# Patient Record
Sex: Female | Born: 1950 | Race: Black or African American | Hispanic: No | Smoking: Never smoker
Health system: Southern US, Community
[De-identification: ages and names within clinical notes are randomized; demographics above are authoritative.]

## PROBLEM LIST (undated history)

## (undated) DIAGNOSIS — I10 Essential (primary) hypertension: Secondary | ICD-10-CM

## (undated) DIAGNOSIS — F32A Depression, unspecified: Secondary | ICD-10-CM

## (undated) DIAGNOSIS — J449 Chronic obstructive pulmonary disease, unspecified: Secondary | ICD-10-CM

## (undated) DIAGNOSIS — E119 Type 2 diabetes mellitus without complications: Secondary | ICD-10-CM

## (undated) DIAGNOSIS — E7800 Pure hypercholesterolemia, unspecified: Secondary | ICD-10-CM

## (undated) DIAGNOSIS — F329 Major depressive disorder, single episode, unspecified: Secondary | ICD-10-CM

## (undated) DIAGNOSIS — I509 Heart failure, unspecified: Secondary | ICD-10-CM

---

## 2016-10-28 ENCOUNTER — Inpatient Hospital Stay (HOSPITAL_COMMUNITY): Payer: Medicare Other

## 2016-10-28 ENCOUNTER — Emergency Department (HOSPITAL_COMMUNITY): Payer: Medicare Other

## 2016-10-28 ENCOUNTER — Encounter (HOSPITAL_COMMUNITY): Payer: Self-pay | Admitting: Emergency Medicine

## 2016-10-28 ENCOUNTER — Inpatient Hospital Stay (HOSPITAL_COMMUNITY)
Admission: EM | Admit: 2016-10-28 | Discharge: 2016-11-10 | DRG: 871 | Disposition: E | Payer: Medicare Other | Attending: Pulmonary Disease | Admitting: Pulmonary Disease

## 2016-10-28 DIAGNOSIS — Z79899 Other long term (current) drug therapy: Secondary | ICD-10-CM | POA: Diagnosis not present

## 2016-10-28 DIAGNOSIS — E119 Type 2 diabetes mellitus without complications: Secondary | ICD-10-CM | POA: Diagnosis present

## 2016-10-28 DIAGNOSIS — N179 Acute kidney failure, unspecified: Secondary | ICD-10-CM | POA: Diagnosis present

## 2016-10-28 DIAGNOSIS — T8579XA Infection and inflammatory reaction due to other internal prosthetic devices, implants and grafts, initial encounter: Secondary | ICD-10-CM

## 2016-10-28 DIAGNOSIS — E669 Obesity, unspecified: Secondary | ICD-10-CM | POA: Diagnosis present

## 2016-10-28 DIAGNOSIS — Z452 Encounter for adjustment and management of vascular access device: Secondary | ICD-10-CM

## 2016-10-28 DIAGNOSIS — M069 Rheumatoid arthritis, unspecified: Secondary | ICD-10-CM | POA: Diagnosis present

## 2016-10-28 DIAGNOSIS — J440 Chronic obstructive pulmonary disease with acute lower respiratory infection: Secondary | ICD-10-CM | POA: Diagnosis present

## 2016-10-28 DIAGNOSIS — A419 Sepsis, unspecified organism: Secondary | ICD-10-CM | POA: Diagnosis present

## 2016-10-28 DIAGNOSIS — G4733 Obstructive sleep apnea (adult) (pediatric): Secondary | ICD-10-CM | POA: Diagnosis present

## 2016-10-28 DIAGNOSIS — R578 Other shock: Secondary | ICD-10-CM | POA: Diagnosis present

## 2016-10-28 DIAGNOSIS — I5032 Chronic diastolic (congestive) heart failure: Secondary | ICD-10-CM | POA: Diagnosis present

## 2016-10-28 DIAGNOSIS — J9601 Acute respiratory failure with hypoxia: Secondary | ICD-10-CM | POA: Diagnosis present

## 2016-10-28 DIAGNOSIS — I469 Cardiac arrest, cause unspecified: Secondary | ICD-10-CM

## 2016-10-28 DIAGNOSIS — R6521 Severe sepsis with septic shock: Secondary | ICD-10-CM | POA: Diagnosis present

## 2016-10-28 DIAGNOSIS — Z66 Do not resuscitate: Secondary | ICD-10-CM | POA: Diagnosis present

## 2016-10-28 DIAGNOSIS — E785 Hyperlipidemia, unspecified: Secondary | ICD-10-CM | POA: Diagnosis present

## 2016-10-28 DIAGNOSIS — Z6841 Body Mass Index (BMI) 40.0 and over, adult: Secondary | ICD-10-CM

## 2016-10-28 DIAGNOSIS — G931 Anoxic brain damage, not elsewhere classified: Secondary | ICD-10-CM | POA: Diagnosis present

## 2016-10-28 DIAGNOSIS — J189 Pneumonia, unspecified organism: Secondary | ICD-10-CM | POA: Diagnosis present

## 2016-10-28 DIAGNOSIS — Z7982 Long term (current) use of aspirin: Secondary | ICD-10-CM | POA: Diagnosis not present

## 2016-10-28 DIAGNOSIS — Z8673 Personal history of transient ischemic attack (TIA), and cerebral infarction without residual deficits: Secondary | ICD-10-CM

## 2016-10-28 DIAGNOSIS — E872 Acidosis: Secondary | ICD-10-CM | POA: Diagnosis present

## 2016-10-28 DIAGNOSIS — Z7951 Long term (current) use of inhaled steroids: Secondary | ICD-10-CM

## 2016-10-28 DIAGNOSIS — I110 Hypertensive heart disease with heart failure: Secondary | ICD-10-CM | POA: Diagnosis present

## 2016-10-28 DIAGNOSIS — E7800 Pure hypercholesterolemia, unspecified: Secondary | ICD-10-CM | POA: Diagnosis present

## 2016-10-28 DIAGNOSIS — R0902 Hypoxemia: Secondary | ICD-10-CM

## 2016-10-28 HISTORY — DX: Essential (primary) hypertension: I10

## 2016-10-28 HISTORY — DX: Heart failure, unspecified: I50.9

## 2016-10-28 HISTORY — DX: Depression, unspecified: F32.A

## 2016-10-28 HISTORY — DX: Type 2 diabetes mellitus without complications: E11.9

## 2016-10-28 HISTORY — DX: Pure hypercholesterolemia, unspecified: E78.00

## 2016-10-28 HISTORY — DX: Chronic obstructive pulmonary disease, unspecified: J44.9

## 2016-10-28 HISTORY — DX: Major depressive disorder, single episode, unspecified: F32.9

## 2016-10-28 LAB — CBC WITH DIFFERENTIAL/PLATELET
BASOS PCT: 0 %
Band Neutrophils: 0 %
Basophils Absolute: 0 10*3/uL (ref 0.0–0.1)
Blasts: 0 %
EOS ABS: 3.5 10*3/uL — AB (ref 0.0–0.7)
EOS PCT: 20 %
HCT: 39.8 % (ref 36.0–46.0)
Hemoglobin: 12.9 g/dL (ref 12.0–15.0)
LYMPHS ABS: 5.7 10*3/uL — AB (ref 0.7–4.0)
Lymphocytes Relative: 33 %
MCH: 32.3 pg (ref 26.0–34.0)
MCHC: 32.4 g/dL (ref 30.0–36.0)
MCV: 99.5 fL (ref 78.0–100.0)
MONO ABS: 0.2 10*3/uL (ref 0.1–1.0)
MYELOCYTES: 0 %
Metamyelocytes Relative: 0 %
Monocytes Relative: 1 %
NEUTROS PCT: 46 %
NRBC: 12 /100{WBCs} — AB
Neutro Abs: 8 10*3/uL — ABNORMAL HIGH (ref 1.7–7.7)
PLATELETS: 60 10*3/uL — AB (ref 150–400)
Promyelocytes Absolute: 0 %
RBC: 4 MIL/uL (ref 3.87–5.11)
RDW: 14.7 % (ref 11.5–15.5)
WBC: 17.4 10*3/uL — AB (ref 4.0–10.5)

## 2016-10-28 LAB — URINALYSIS, ROUTINE W REFLEX MICROSCOPIC
BILIRUBIN URINE: NEGATIVE
GLUCOSE, UA: NEGATIVE mg/dL
KETONES UR: NEGATIVE mg/dL
Nitrite: NEGATIVE
PROTEIN: 30 mg/dL — AB
Specific Gravity, Urine: 1.021 (ref 1.005–1.030)
pH: 5 (ref 5.0–8.0)

## 2016-10-28 LAB — COMPREHENSIVE METABOLIC PANEL
ALT: 1348 U/L — AB (ref 14–54)
ANION GAP: 19 — AB (ref 5–15)
AST: 1870 U/L — ABNORMAL HIGH (ref 15–41)
Albumin: 2.4 g/dL — ABNORMAL LOW (ref 3.5–5.0)
Alkaline Phosphatase: 228 U/L — ABNORMAL HIGH (ref 38–126)
BUN: 50 mg/dL — ABNORMAL HIGH (ref 6–20)
CHLORIDE: 102 mmol/L (ref 101–111)
CO2: 11 mmol/L — AB (ref 22–32)
CREATININE: 4.01 mg/dL — AB (ref 0.44–1.00)
Calcium: 7.4 mg/dL — ABNORMAL LOW (ref 8.9–10.3)
GFR, EST AFRICAN AMERICAN: 13 mL/min — AB (ref >60)
GFR, EST NON AFRICAN AMERICAN: 11 mL/min — AB (ref >60)
Glucose, Bld: 114 mg/dL — ABNORMAL HIGH (ref 65–99)
POTASSIUM: 5.7 mmol/L — AB (ref 3.5–5.1)
SODIUM: 132 mmol/L — AB (ref 135–145)
Total Bilirubin: 2.5 mg/dL — ABNORMAL HIGH (ref 0.3–1.2)
Total Protein: 5.8 g/dL — ABNORMAL LOW (ref 6.5–8.1)

## 2016-10-28 LAB — I-STAT ARTERIAL BLOOD GAS, ED
ACID-BASE DEFICIT: 19 mmol/L — AB (ref 0.0–2.0)
Acid-base deficit: 26 mmol/L — ABNORMAL HIGH (ref 0.0–2.0)
BICARBONATE: 5.1 mmol/L — AB (ref 20.0–28.0)
Bicarbonate: 13.2 mmol/L — ABNORMAL LOW (ref 20.0–28.0)
O2 SAT: 42 %
O2 SAT: 95 %
PCO2 ART: 29.4 mmHg — AB (ref 32.0–48.0)
PH ART: 6.823 — AB (ref 7.350–7.450)
TCO2: 6 mmol/L (ref 0–100)
TCO2: 15 mmol/L (ref 0–100)
pCO2 arterial: 50.8 mmHg — ABNORMAL HIGH (ref 32.0–48.0)
pH, Arterial: 7.001 — CL (ref 7.350–7.450)
pO2, Arterial: 36 mmHg — CL (ref 83.0–108.0)
pO2, Arterial: 100 mmHg (ref 83.0–108.0)

## 2016-10-28 LAB — GLUCOSE, CAPILLARY
GLUCOSE-CAPILLARY: 92 mg/dL (ref 65–99)
Glucose-Capillary: 102 mg/dL — ABNORMAL HIGH (ref 65–99)
Glucose-Capillary: 96 mg/dL (ref 65–99)

## 2016-10-28 LAB — I-STAT CHEM 8, ED
BUN: 60 mg/dL — ABNORMAL HIGH (ref 6–20)
CALCIUM ION: 0.91 mmol/L — AB (ref 1.15–1.40)
Chloride: 105 mmol/L (ref 101–111)
Creatinine, Ser: 3.5 mg/dL — ABNORMAL HIGH (ref 0.44–1.00)
Glucose, Bld: 119 mg/dL — ABNORMAL HIGH (ref 65–99)
HEMATOCRIT: 38 % (ref 36.0–46.0)
HEMOGLOBIN: 12.9 g/dL (ref 12.0–15.0)
Potassium: 4.8 mmol/L (ref 3.5–5.1)
SODIUM: 137 mmol/L (ref 135–145)
TCO2: 14 mmol/L (ref 0–100)

## 2016-10-28 LAB — I-STAT TROPONIN, ED: Troponin i, poc: 0.03 ng/mL (ref 0.00–0.08)

## 2016-10-28 LAB — TYPE AND SCREEN
ABO/RH(D): B POS
ANTIBODY SCREEN: POSITIVE
DAT, IgG: POSITIVE

## 2016-10-28 LAB — I-STAT CG4 LACTIC ACID, ED: Lactic Acid, Venous: 3.99 mmol/L (ref 0.5–1.9)

## 2016-10-28 LAB — PROCALCITONIN: Procalcitonin: 1 ng/mL

## 2016-10-28 LAB — MRSA PCR SCREENING: MRSA BY PCR: POSITIVE — AB

## 2016-10-28 LAB — LACTIC ACID, PLASMA: Lactic Acid, Venous: 12.9 mmol/L (ref 0.5–1.9)

## 2016-10-28 LAB — APTT: aPTT: 130 seconds — ABNORMAL HIGH (ref 24–36)

## 2016-10-28 LAB — TROPONIN I: TROPONIN I: 0.29 ng/mL — AB (ref <0.03)

## 2016-10-28 MED ORDER — SODIUM CHLORIDE 0.9 % IV SOLN: INTRAVENOUS | Status: DC

## 2016-10-28 MED ORDER — MUPIROCIN 2 % EX OINT
1.0000 "application " | TOPICAL_OINTMENT | Freq: Two times a day (BID) | CUTANEOUS | Status: DC
  Administered 2016-10-29: 1 via NASAL
  Filled 2016-10-28: qty 22

## 2016-10-28 MED ORDER — CHLORHEXIDINE GLUCONATE CLOTH 2 % EX PADS: 6.0000 | MEDICATED_PAD | Freq: Every day | CUTANEOUS | Status: DC

## 2016-10-28 MED ORDER — INSULIN ASPART 100 UNIT/ML ~~LOC~~ SOLN: 0.0000 [IU] | SUBCUTANEOUS | Status: DC

## 2016-10-28 MED ORDER — PIPERACILLIN-TAZOBACTAM 3.375 G IVPB 30 MIN
3.3750 g | Freq: Once | INTRAVENOUS | Status: AC
  Administered 2016-10-28: 3.375 g via INTRAVENOUS
  Filled 2016-10-28: qty 50

## 2016-10-28 MED ORDER — MIDAZOLAM HCL 2 MG/2ML IJ SOLN: 2.0000 mg | Freq: Once | INTRAMUSCULAR | Status: DC

## 2016-10-28 MED ORDER — NOREPINEPHRINE BITARTRATE 1 MG/ML IV SOLN
0.0000 ug/min | Freq: Once | INTRAVENOUS | Status: AC
  Administered 2016-10-28: 10 ug/min via INTRAVENOUS
  Filled 2016-10-28: qty 4

## 2016-10-28 MED ORDER — METHYLPREDNISOLONE SODIUM SUCC 40 MG IJ SOLR
40.0000 mg | Freq: Two times a day (BID) | INTRAMUSCULAR | Status: DC
  Administered 2016-10-28: 40 mg via INTRAVENOUS
  Filled 2016-10-28 (×2): qty 1

## 2016-10-28 MED ORDER — NOREPINEPHRINE BITARTRATE 1 MG/ML IV SOLN: 0.0000 ug/min | INTRAVENOUS | Status: DC

## 2016-10-28 MED ORDER — FAMOTIDINE IN NACL 20-0.9 MG/50ML-% IV SOLN: 20.0000 mg | Freq: Two times a day (BID) | INTRAVENOUS | Status: DC

## 2016-10-28 MED ORDER — FENTANYL CITRATE (PF) 100 MCG/2ML IJ SOLN
50.0000 ug | Freq: Once | INTRAMUSCULAR | Status: AC
  Administered 2016-10-28: 50 ug via INTRAVENOUS

## 2016-10-28 MED ORDER — HYDROCORTISONE NA SUCCINATE PF 100 MG IJ SOLR
50.0000 mg | Freq: Four times a day (QID) | INTRAMUSCULAR | Status: DC
  Administered 2016-10-29: 50 mg via INTRAVENOUS
  Filled 2016-10-28: qty 2

## 2016-10-28 MED ORDER — HEPARIN SODIUM (PORCINE) 5000 UNIT/ML IJ SOLN
5000.0000 [IU] | Freq: Three times a day (TID) | INTRAMUSCULAR | Status: DC
  Filled 2016-10-28 (×2): qty 1

## 2016-10-28 MED ORDER — SODIUM CHLORIDE 0.9 % IV BOLUS (SEPSIS)
1000.0000 mL | Freq: Once | INTRAVENOUS | Status: AC
  Administered 2016-10-29: 1000 mL via INTRAVENOUS

## 2016-10-28 MED ORDER — NOREPINEPHRINE BITARTRATE 1 MG/ML IV SOLN
0.0000 ug/min | INTRAVENOUS | Status: DC
  Filled 2016-10-28 (×3): qty 16

## 2016-10-28 MED ORDER — PIPERACILLIN-TAZOBACTAM IN DEX 2-0.25 GM/50ML IV SOLN
2.2500 g | Freq: Four times a day (QID) | INTRAVENOUS | Status: DC
  Filled 2016-10-28 (×2): qty 50

## 2016-10-28 MED ORDER — SODIUM CHLORIDE 0.9 % IV BOLUS (SEPSIS)
2000.0000 mL | Freq: Once | INTRAVENOUS | Status: AC
  Administered 2016-10-28: 2000 mL via INTRAVENOUS

## 2016-10-28 MED ORDER — EPINEPHRINE PF 1 MG/ML IJ SOLN
0.5000 ug/min | INTRAVENOUS | Status: DC
  Administered 2016-10-29: 20 ug/min via INTRAVENOUS
  Filled 2016-10-28 (×4): qty 4

## 2016-10-28 MED ORDER — BUDESONIDE 0.5 MG/2ML IN SUSP
0.5000 mg | Freq: Two times a day (BID) | RESPIRATORY_TRACT | Status: DC
  Administered 2016-10-28: 0.5 mg via RESPIRATORY_TRACT
  Filled 2016-10-28 (×2): qty 2

## 2016-10-28 MED ORDER — EPINEPHRINE PF 1 MG/ML IJ SOLN
0.5000 ug/min | INTRAVENOUS | Status: DC
  Administered 2016-10-28: 5 ug/min via INTRAVENOUS
  Filled 2016-10-28 (×2): qty 4

## 2016-10-28 MED ORDER — VASOPRESSIN 20 UNIT/ML IV SOLN
0.0300 [IU]/min | INTRAVENOUS | Status: DC
  Administered 2016-10-28: 0.03 [IU]/min via INTRAVENOUS
  Filled 2016-10-28 (×2): qty 2

## 2016-10-28 MED ORDER — IPRATROPIUM-ALBUTEROL 0.5-2.5 (3) MG/3ML IN SOLN
3.0000 mL | Freq: Four times a day (QID) | RESPIRATORY_TRACT | Status: DC
  Administered 2016-10-28: 3 mL via RESPIRATORY_TRACT
  Filled 2016-10-28: qty 3

## 2016-10-28 MED ORDER — VANCOMYCIN HCL 10 G IV SOLR
1750.0000 mg | Freq: Once | INTRAVENOUS | Status: AC
  Administered 2016-10-28: 1750 mg via INTRAVENOUS
  Filled 2016-10-28 (×2): qty 1750

## 2016-10-28 MED ORDER — FENTANYL CITRATE (PF) 100 MCG/2ML IJ SOLN
INTRAMUSCULAR | Status: AC
  Filled 2016-10-28: qty 2

## 2016-10-28 MED ORDER — SODIUM CHLORIDE 0.9 % IV SOLN: 250.0000 mL | INTRAVENOUS | Status: DC | PRN

## 2016-10-28 NOTE — Progress Notes (Signed)
eLink Physician-Brief Progress Note Patient Name: Desiree Moore DOB: 08/27/1950 MRN: [id number]   Date of Service  11/03/2016  HPI/Events of Note  Case reviewed as requested by bedside MD and EICU nurses.  Unfortunate situation of a lady admitted post cardiac arrest today after suddenly feeling dyspneic at her nursing home.  Prolonged cardiac arrest, intubated, in refractory shock.  Family refusing CVL despite conversation with multiple providers  eICU Interventions  Will continue current management but will stop Sepsis protocol as the patient has not had a temperature recorded or a WBC count. We will continue fluid administration and IV antibiotics, but it is unclear that she is septic right now and do not want to cause fluid overload with 30cc/kg saline bolus.     Intervention Category Major Interventions: Sepsis - evaluation and management  Max Fickle 10/30/2016, 7:28 PM

## 2016-10-28 NOTE — H&P (Signed)
PULMONARY / CRITICAL CARE MEDICINE   Name: Desiree Moore MRN: [id number] DOB: [birth year]    ADMISSION DATE:  Nov 27, 2016 CONSULTATION DATE:  2016/11/27  REFERRING MD:  EDP - [place]  CHIEF COMPLAINT:  Cardiac arrest  HISTORY OF PRESENT ILLNESS:   66 year old female with history of COPD and CHF who lives in a nursing home who started complaining of SOB and on the day of admission the patient c/o unable to breath.  By the time EMS arrived the patient was in cardiac arrest.  Patient had an arrest for 30-40 minutes til ROSC.  Patient is completely unresponsive but with a respiratory drive.  No further history is available.  PAST MEDICAL HISTORY :  She  has a past medical history of CHF (congestive heart failure) (HCC); COPD (chronic obstructive pulmonary disease) (HCC); Depression; Diabetes mellitus without complication (HCC); High cholesterol; and Hypertension.  PAST SURGICAL HISTORY: She  has no past surgical history on file.  Allergies  Allergen Reactions  . Sulfa Antibiotics Hives, Itching and Rash    No current facility-administered medications on file prior to encounter.    No current outpatient prescriptions on file prior to encounter.    FAMILY HISTORY:  Her has no family status information on file.    SOCIAL HISTORY: She  reports that she has never smoked. She does not have any smokeless tobacco history on file. She reports that she does not drink alcohol or use drugs.  REVIEW OF SYSTEMS:   Unable to attain, patient is unresponsive  SUBJECTIVE:  Patient unresponsive  VITAL SIGNS: BP (!) 80/57   Pulse 90   Resp 19   Ht 5\' 1"  (1.549 m)   SpO2 100%   HEMODYNAMICS:    VENTILATOR SETTINGS: Vent Mode: PRVC FiO2 (%):  [100 %] 100 % Set Rate:  [14 bmp-18 bmp] 18 bmp Vt Set:  [400 mL-460 mL] 460 mL PEEP:  [5 cmH20-15 cmH20] 15 cmH20 Plateau Pressure:  [18 cmH20] 18 cmH20  INTAKE / OUTPUT: No intake/output data recorded.  PHYSICAL EXAMINATION: General:   Chronically ill appearing female that is acutely ill. Neuro:  Unresponsive, does not withdraw to pain, no gag, pupillary or corneal reflexes. HEENT:  Otho/AT, eye exam as above. Cardiovascular:  RRR, Nl S1/S2, -M/R/G. Lungs:  Coarse BS diffusely. Abdomen:  Soft, NT, ND and +BS Musculoskeletal:  -edema and -tenderness Skin:  Intact  LABS:  BMET  Recent Labs Lab 11/27/2016 1557  NA 137  K 4.8  CL 105  BUN 60*  CREATININE 3.50*  GLUCOSE 119*    Electrolytes No results for input(s): CALCIUM, MG, PHOS in the last 168 hours.  CBC  Recent Labs Lab 11-27-16 1557  HGB 12.9  HCT 38.0    Coag's No results for input(s): APTT, INR in the last 168 hours.  Sepsis Markers  Recent Labs Lab 2016/11/27 1623  LATICACIDVEN 3.99*    ABG  Recent Labs Lab 2016/11/27 1614  PHART 6.823*  PCO2ART 29.4*  PO2ART 36.0*    Liver Enzymes No results for input(s): AST, ALT, ALKPHOS, BILITOT, ALBUMIN in the last 168 hours.  Cardiac Enzymes No results for input(s): TROPONINI, PROBNP in the last 168 hours.  Glucose No results for input(s): GLUCAP in the last 168 hours.  Imaging Dg Chest Port 1 View  Result Date: 11-27-16 CLINICAL DATA:  Endotracheal tube adjustment.  Intubation granuloma. EXAM: PORTABLE CHEST 1 VIEW COMPARISON:  One-view chest x-ray from the same day. FINDINGS: Heart size is somewhat obscured by diffuse interstitial  and airspace disease. The endotracheal tube has been pulled back and now terminates 18 mm above the carina. Diffuse interstitial and airspace disease is unchanged. Defibrillator pads are in place. IMPRESSION: 1. The endotracheal tube has been pulled back and now terminates 18 mm above the carina. It could be pulled back 1-2 cm for more optimal positioning. 2. Similar appearance of diffuse interstitial and airspace disease likely reflecting edema. Infection is not excluded. Electronically Signed   By: Marin Roberts M.D.   On: 10/14/2016 16:02   Dg Chest  Portable 1 View  Result Date: 11/09/2016 CLINICAL DATA:  Post CPR, intubation EXAM: PORTABLE CHEST 1 VIEW COMPARISON:  Portable exam 1536 hours without priors for comparison FINDINGS: Tip of endotracheal tube projects in RIGHT mainstem bronchus; this has already been repositioned at time of interpretation of this study. External pacing leads present. Enlargement of cardiac silhouette. Diffuse pulmonary infiltrates question pulmonary edema. Atherosclerotic calcification aorta. No gross pleural effusion or pneumothorax. Osseous structures appear demineralized with multilevel degenerative disc disease changes thoracic spine. IMPRESSION: RIGHT mainstem bronchus intubation ; this has already been repositioned and reimaged at time of interpretation of this study. Diffuse pulmonary infiltrates question pulmonary edema. Aortic atherosclerosis. Electronically Signed   By: Ulyses Southward M.D.   On: 11/07/2016 16:00     STUDIES:  Head CT 6/18>>>  CULTURES: Blood culture 6/18>>> Urine 6/18>>> Sputum 6/18>>>  ANTIBIOTICS: Zosyn 6/18>>> Vancomycin 6/18>>>  SIGNIFICANT EVENTS:  6/18>>> cardiac arrest  LINES/TUBES: PIV L radial a-line 6/18>>> ETT 6/18>>>  DISCUSSION: 66 year old female with history of CHF and COPD presenting with likely a pneumonia and a COPD exacerbation resulting in cardiac arrest.  Patient is severely acidotic and is in refractory shock.  ASSESSMENT / PLAN:  PULMONARY A: Acute respiratory failure COPD CHF HCAP P:   - Full vent support - PCV with f/u ABG - Adjust vent for ABG - Solumedrol - Nebulizer - Titrate O2 for sat of 88-92% with FiO2 of 80% and PEEP of 14  CARDIOVASCULAR A:  Cardiac arrest, PEA Refractory shock P:  - Cardiology consult - Change epi to norepi/vasopressin  - EKG - 2D echo - Patient is in refractory shock, not a candidate for cooling due to need for both epi and norepi  RENAL A:   Acute renal failure, unsure of baseline P:   - IVF NS  100 ml/hr - BMET in AM - Replace electrolytes as indicated  GASTROINTESTINAL A:   No active issues P:   - NPO  HEMATOLOGIC A:   No active issues P:  - F/U on labs - Transfuse per ICU protocol  INFECTIOUS A:   HCAP P:   - Vanc - Zosyn - Pan culture - Sepsis protocol  ENDOCRINE A:   DM   P:   - ISS - CBGs - Check cortisol - Solumedrol  NEUROLOGIC A:   ?anoxic vs metabolic injury P:   RASS goal: 0 - Hold all sedation - Head CT now - If stabilizes then will consider EEG and MRI but not at this point   FAMILY  - Updates: Son is bedside, he is refusing any conversations regarding code status.  We asked him to evacuate room for placement of a central line and he insisted that we are trying to kill his mother and refused to sign consent.  Unfortunately there are no other family members available at this time.  He understood that his dose of pressors through peripheral line could result in skin death and  loss of limbs and organ failure but insisted that does not wish for any other procedures to be done on his mother.  Given that he appeared of sound mind we will not perform central line as we are unable to get consent and will not cool since patient is in refractory shock.  Will order head CT.  - Inter-disciplinary family meet or Palliative Care meeting due by:  6/25  The patient is critically ill with multiple organ systems failure and requires high complexity decision making for assessment and support, frequent evaluation and titration of therapies, application of advanced monitoring technologies and extensive interpretation of multiple databases.   Critical Care Time devoted to patient care services described in this note is  120  Minutes. This time reflects time of care of this signee Dr Koren Bound. This critical care time does not reflect procedure time, or teaching time or supervisory time of PA/NP/Med student/Med Resident etc but could involve care discussion  time.  Alyson Reedy, M.D. [facility] Pulmonary/Critical Care Medicine. Pager: [phone]. After hours pager: [phone].  11/17/2016, 5:17 PM

## 2016-10-28 NOTE — Progress Notes (Signed)
Pharmacy Antibiotic Note  Desiree Moore is a 66 y.o. female admitted on November 05, 2016 with SOB from nursing facility. Pt had a cardiac arrest before EMS reached the patient, CPR was performed for 30-40 minutes, now requiring pressors. Planning to start broad spectrum antibiotics for possible HCAP.  LA 3.9, SCr 3.5.   Plan: -Zosyn 3.375 g IV x1 then 2.25 g IV q6h -Vancomycin 1750 mg IV x1 - maintenance doses based off of VR -Monitor renal fx, cultures VR as indicated   Height: 5\' 1"  (154.9 cm) IBW/kg (Calculated) : 47.8  No data recorded.   Recent Labs Lab 11/05/2016 1557 Nov 05, 2016 1623  CREATININE 3.50*  --   LATICACIDVEN  --  3.99*    CrCl cannot be calculated (Unknown ideal weight.).    Allergies  Allergen Reactions  . Sulfa Antibiotics Hives, Itching and Rash    Antimicrobials this admission: 6/18 zosyn > 6/18 vancomycin >  Dose adjustments this admission: N/A  Microbiology results: 6/18 blood cx: 6/18 urine cx: 6/18 resp cx:   7/18 11/05/16 5:36 PM

## 2016-10-28 NOTE — Progress Notes (Signed)
CRITICAL VALUE ALERT  Critical Value:  Lactic Acid 12.9  Date & Time Notied:  2215  Provider Notified: Dr. Kendrick Fries  Orders Received/Actions taken: 2 Liters NS ordered, hydrocortisone ordered

## 2016-10-28 NOTE — Consult Note (Signed)
CARDIOLOGY CONSULT NOTE  Patient ID: Desiree Moore MRN: [id number] DOB/AGE: [birth year] 64 y.o.  Admit date: 10/31/2016 Referring Physician  Charlestine Massed, MD Primary Physician:  Charlott Rakes, MD Reason for Consultation  CHF  HPI: Desiree Moore  is a 66 y.o. female  With history of hypertension, hyperlipidemia, interstitial lung disease, rheumatoid arthritis, severe COPD and obstructive sleep apnea and see, last major admission to [facility] on 10/17/2016 with new onset of stroke when she presented with left-sided facial numbness and weakness, MRI had revealed numerous infarcts in the right hemisphere in the MCA distribution. At the time she was found to have normal echocardiogram and EF, carotids where unremarkable, she was discharged to nursing home on aspirin and smoking cessation in addition to her antihypertensive medications. She had been doing well until today she complained of worsening dyspnea on exertion, EMS was activated, patient had cardiac arrest at that point and had 30-40 minutes of CPR until ROSC. I was asked to see the patient for management of heart failure. Patient is presently intubated and her examination and history is via chart review.  Past Medical History:  Diagnosis Date  . CHF (congestive heart failure) (HCC)   . COPD (chronic obstructive pulmonary disease) (HCC)   . Depression   . Diabetes mellitus without complication (HCC)   . High cholesterol   . Hypertension      No past surgical history on file.   No family history on file.   Social History: Social History   Social History  . Marital status: Divorced    Spouse name: N/A  . Number of children: N/A  . Years of education: N/A   Occupational History  . Not on file.   Social History Main Topics  . Smoking status: Never Smoker  . Smokeless tobacco: Not on file     Comment: unknown at this time  . Alcohol use No     Comment: unknown at this time  . Drug use: No     Comment: unknown at this  time  . Sexual activity: Not on file   Other Topics Concern  . Not on file   Social History Narrative  . No narrative on file     Prescriptions Prior to Admission  Medication Sig Dispense Refill Last Dose  . acetaminophen (TYLENOL) 325 MG tablet Take 650 mg by mouth every 4 (four) hours as needed (for pain).   PRN at PRN  . aspirin EC 81 MG tablet Take 81 mg by mouth daily.   2016/10/31 at 0800  . atorvastatin (LIPITOR) 80 MG tablet Take 80 mg by mouth daily.   10/27/2016 at 1700  . budesonide-formoterol (SYMBICORT) 160-4.5 MCG/ACT inhaler Inhale 2 puffs into the lungs 2 (two) times daily.   10-31-2016 at 0900  . buPROPion (WELLBUTRIN) 100 MG tablet Take 100 mg by mouth 2 (two) times daily.   2016/10/31 at 0900  . docusate sodium (COLACE) 100 MG capsule Take 100 mg by mouth daily.   31-Oct-2016 at 0900  . DULoxetine HCl 40 MG CPEP Take 40 mg by mouth every morning.   10-31-2016 at 0700  . hydrochlorothiazide (HYDRODIURIL) 25 MG tablet Take 25 mg by mouth daily.   2016-10-31 at 0900  . hydrocortisone 2.5 % cream Apply 1 application topically 2 (two) times daily as needed. TO AFFECTED AREAS OF FACE   PRN at PRN  . morphine (MSIR) 15 MG tablet Take 15 mg by mouth 3 (three) times daily as needed (for pain).  20-Nov-2016 at 0800  . naproxen (NAPROSYN) 500 MG tablet Take 500 mg by mouth 2 (two) times daily with a meal.   11/20/16 at 0900  . nicotine (NICODERM CQ - DOSED IN MG/24 HOURS) 21 mg/24hr patch Place 21 mg onto the skin daily. Remove old patch before applying new one   11/20/16 at 0900  . nicotine polacrilex (NICORETTE) 4 MG gum 4 mg See admin instructions. PLACE TO CHEEK EVERY HOUR AS NEEDED FOR SMOKING CESSATION   PRN at PRN  . polyethylene glycol powder (GLYCOLAX/MIRALAX) powder Take 17 g by mouth daily as needed for mild constipation. TO BE MIXED IN 8 OUNCES OF WATER   PRN at PRN  . PrednisoLONE 5 MG (21) TBPK Take 5-30 mg by mouth See admin instructions. TAKE AS A 6-DAY'S TAPER:  6-5-4-3-2-1-D/C   11/20/16 at 0700  . spironolactone (ALDACTONE) 25 MG tablet Take 25 mg by mouth daily with breakfast.   Nov 20, 2016 at 0800     Review of Systems - Unable to be obtained.    Physical Exam: Blood pressure (!) 146/112, pulse 93, resp. rate (!) 28, height 5\' 1"  (1.549 m), SpO2 97 %.   General appearance: Intubated presently sedated. Lungs: Extensive diffuse crackles present bilaterally. Heart: regular rate and rhythm, S1, S2 normal, no murmur, click, rub or gallop Abdomen: Soft, obese, bowel sounds sluggish and distant Extremities: extremities normal, atraumatic, no cyanosis or edema extremities are cool to touch. Pulses: Bilateral carotids normal without bruit, femoral could not be felt, pedal pulses are also absent. Extremity is cool. Neurologic: Patient unresponsive and intubated.  Labs:  Lab Results  Component Value Date   HGB 12.9 11-20-16   HCT 38.0 11-20-16    Recent Labs Lab 11-20-16 1557  NA 137  K 4.8  CL 105  BUN 60*  CREATININE 3.50*  GLUCOSE 119*    Lipid Panel  No results found for: CHOL, TRIG, HDL, CHOLHDL, VLDL, LDLCALC  BNP (last 3 results) No results for input(s): BNP in the last 8760 hours.  HEMOGLOBIN A1C No results found for: HGBA1C, MPG  Cardiac Panel (last 3 results) No results for input(s): CKTOTAL, CKMB, TROPONINI, RELINDX in the last 8760 hours.  No results found for: CKTOTAL, CKMB, CKMBINDEX, TROPONINI   TSH No results for input(s): TSH in the last 8760 hours.  Radiology: Dg Chest Port 1 View  Result Date: 11/20/2016 CLINICAL DATA:  Endotracheal tube adjustment.  Intubation granuloma. EXAM: PORTABLE CHEST 1 VIEW COMPARISON:  One-view chest x-ray from the same day. FINDINGS: Heart size is somewhat obscured by diffuse interstitial and airspace disease. The endotracheal tube has been pulled back and now terminates 18 mm above the carina. Diffuse interstitial and airspace disease is unchanged. Defibrillator pads are in  place. IMPRESSION: 1. The endotracheal tube has been pulled back and now terminates 18 mm above the carina. It could be pulled back 1-2 cm for more optimal positioning. 2. Similar appearance of diffuse interstitial and airspace disease likely reflecting edema. Infection is not excluded. Electronically Signed   By: 10/30/2016 M.D.   On: 11/20/2016 16:02   Dg Chest Portable 1 View  Result Date: 2016-11-20 CLINICAL DATA:  Post CPR, intubation EXAM: PORTABLE CHEST 1 VIEW COMPARISON:  Portable exam 1536 hours without priors for comparison FINDINGS: Tip of endotracheal tube projects in RIGHT mainstem bronchus; this has already been repositioned at time of interpretation of this study. External pacing leads present. Enlargement of cardiac silhouette. Diffuse pulmonary infiltrates question pulmonary edema. Atherosclerotic calcification  aorta. No gross pleural effusion or pneumothorax. Osseous structures appear demineralized with multilevel degenerative disc disease changes thoracic spine. IMPRESSION: RIGHT mainstem bronchus intubation ; this has already been repositioned and reimaged at time of interpretation of this study. Diffuse pulmonary infiltrates question pulmonary edema. Aortic atherosclerosis. Electronically Signed   By: Ulyses Southward M.D.   On: 11/03/2016 16:00    Scheduled Meds: . budesonide (PULMICORT) nebulizer solution  0.5 mg Nebulization BID  . fentaNYL      . heparin  5,000 Units Subcutaneous Q8H  . insulin aspart  0-9 Units Subcutaneous Q4H  . ipratropium-albuterol  3 mL Nebulization Q6H  . methylPREDNISolone (SOLU-MEDROL) injection  40 mg Intravenous Q12H  . midazolam  2 mg Intravenous Once   Continuous Infusions: . sodium chloride    . sodium chloride    . [START ON 04-Nov-2016] famotidine (PEPCID) IV    . norepinephrine (LEVOPHED) Adult infusion    . [START ON Nov 04, 2016] piperacillin-tazobactam (ZOSYN)  IV    . piperacillin-tazobactam    . vancomycin    . vasopressin  (PITRESSIN) infusion - *FOR SHOCK*     PRN Meds:.sodium chloride  CARDIAC STUDIES:  EKG 10/26/2016: Normal sinus rhythm at rate of 93 beats a minute, left axis deviation, left ventricle fascicular block. Right bundle branch block. Normal QT interval. No evidence of ischemia.  ECHO: Pending  [facility] Echocardiogram 10/18/2016: Normal LV systolic function, EF 60-65%. Mild aortic sclerosis without stenosis. Mild tricuspid regurgitation. Normal right ventricular systolic function.  ASSESSMENT AND PLAN:  1. Cardiac arrest most probably pulmonary etiology as the primary. She is suicidal. He, EKG in spite of severe hypotension does not reveal any ischemic changes. With history of recent stroke, consider aspiration pneumonia leading to acute respiratory distress on top of underlying COPD. 2. 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 Hypertension, presently hypotensive 4. Hyperlipidemia 5. Severe COPD and history of interstitial lung disease 6. Hyperlipidemia 7. History of stroke 2 weeks ago.  Recommendation: Patient's presentation appears to be most consistent with pulmonary etiology. Her long-term prognosis for recovery appears to be grim in view of prolonged CPR. Also she continues to need inotropic support. Nothing specific to recommend from cardiac standpoint, I'll continue to follow her along the sidelines.       Yates Decamp, MD 11/08/2016, 6:40 PM [facility] [place] Pager: [phone] Office: [phone] If no answer Cell [phone]

## 2016-10-28 NOTE — Progress Notes (Signed)
Chaplain was paged for support of a Pt in critical condition that coded earlier. Chaplain checked in at the front desk to find out more information on the Pt. Was informed son was creating a problem . He was in the hallway. Upon entering the room sister and niece was at the bedside noticalablly grieving. Chaplain introduce himself and asked if they need any assistance. Chaplain asked if they needed prayer and they responded affirmative. Chaplain [prayed a pray for comfort, healing and peace.  Chaplain remained asking family to touch and talk to the Pt and they did. This seemed to help the family because they were assisting in the healing. Chaplain remain for a short time and encouraged sister to do some self care. They left the room and the Chaplain walked them to the waiting area and departed.   11-19-2016 2000  Clinical Encounter Type  Visited With Patient and family together  Visit Type Initial;Spiritual support  Referral From Nurse  Spiritual Encounters  Spiritual Needs Prayer;Emotional  Stress Factors  Patient Stress Factors Major life changes  Family Stress Factors Major life changes  .

## 2016-10-28 NOTE — ED Provider Notes (Signed)
[facility]EM[facility] DEPT Provider Note   CSN: [id number] Arrival date & time: 11/03/2016  1530     History   Chief Complaint Chief Complaint  Patient presents with  . Post-CPR    HPI Desiree Moore is a 66 y.o. female.  HPI  Patient is a 66 year old female past medical history significant for COPD, CHF, recent CVA currently in a rehabilitation facility, who presents to the emergency department post CPR. Per EMS patient was complaining of shortness of breath and then suddenly became pulseless. Rehabilitation facility started CPR for approximately 5 minutes prior to EMS arrival. When EMS arrived initial rhythm asystole. CPR for approximately 30 minutes. Asystole in PEA. Given epi and then started on epi infusion. Intubated prior to arrival. ROSC after 30 mins.    Past Medical History:  Diagnosis Date  . CHF (congestive heart failure) (HCC)   . COPD (chronic obstructive pulmonary disease) (HCC)   . Depression   . Diabetes mellitus without complication (HCC)   . High cholesterol   . Hypertension     Patient Active Problem List   Diagnosis Date Noted  . Septic shock (HCC) 11/06/2016  . Cardiac arrest (HCC)   . Encounter for central line placement     No past surgical history on file.  OB History    No data available       Home Medications    Prior to Admission medications   Medication Sig Start Date End Date Taking? Authorizing Provider  acetaminophen (TYLENOL) 325 MG tablet Take 650 mg by mouth every 4 (four) hours as needed (for pain).   Yes [provider]  aspirin EC 81 MG tablet Take 81 mg by mouth daily.   Yes [provider]  atorvastatin (LIPITOR) 80 MG tablet Take 80 mg by mouth daily.   Yes [provider]  budesonide-formoterol (SYMBICORT) 160-4.5 MCG/ACT inhaler Inhale 2 puffs into the lungs 2 (two) times daily.   Yes [provider]  buPROPion (WELLBUTRIN) 100 MG tablet Take 100 mg by mouth 2 (two) times daily.   Yes [provider]  docusate sodium (COLACE) 100 MG capsule Take 100 mg by mouth daily.   Yes [provider]  DULoxetine HCl 40 MG CPEP Take 40 mg by mouth every morning.   Yes [provider]  hydrochlorothiazide (HYDRODIURIL) 25 MG tablet Take 25 mg by mouth daily.   Yes [provider]  hydrocortisone 2.5 % cream Apply 1 application topically 2 (two) times daily as needed. TO AFFECTED AREAS OF FACE   Yes [provider]  morphine (MSIR) 15 MG tablet Take 15 mg by mouth 3 (three) times daily as needed (for pain).   Yes [provider]  naproxen (NAPROSYN) 500 MG tablet Take 500 mg by mouth 2 (two) times daily with a meal.   Yes [provider]  nicotine (NICODERM CQ - DOSED IN MG/24 HOURS) 21 mg/24hr patch Place 21 mg onto the skin daily. Remove old patch before applying new one   Yes [provider]  nicotine polacrilex (NICORETTE) 4 MG gum 4 mg See admin instructions. PLACE TO CHEEK EVERY HOUR AS NEEDED FOR SMOKING CESSATION   Yes [provider]  polyethylene glycol powder (GLYCOLAX/MIRALAX) powder Take 17 g by mouth daily as needed for mild constipation. TO BE MIXED IN 8 OUNCES OF WATER   Yes [provider]  PrednisoLONE 5 MG (21) TBPK Take 5-30 mg by mouth See admin instructions. TAKE AS A 6-DAY'S TAPER: 6-5-4-3-2-1-D/C  Yes [provider]  spironolactone (ALDACTONE) 25 MG tablet Take 25 mg by mouth daily with breakfast.   Yes [provider]    Family History No family history on file.  Social History Social History  Substance Use Topics  . Smoking status: Never Smoker  . Smokeless tobacco: Not on file     Comment: unknown at this time  . Alcohol use No     Comment: unknown at this time     Allergies   Sulfa antibiotics   Review of Systems Review of Systems  Unable to perform ROS: Intubated     Physical Exam Updated Vital Signs BP (!) 85/63   Pulse (!) 56   Temp (!)  93.9 F (34.4 C)   Resp (!) 23   Ht 5\' 1"  (1.549 m)   SpO2 (!) 82%   Physical Exam  Constitutional:  Obese  HENT:  Head: Atraumatic.  Eyes:  4 mm and equal bilaterally, minimally reactive, sluggish  Neck: No JVD present. No tracheal deviation present.  Cardiovascular: Normal rate and intact distal pulses.   No murmur heard. Pulmonary/Chest: She has rales ( Diffuse rhonchi throughout).  7.0 ETT, ETT holder in place. Significant bright red blood from the mouth and nose. Good air movement throughout. No wheezing.  Abdominal: There is no tenderness.  Musculoskeletal: She exhibits no edema.  No unilateral leg swelling. IO in bilateral lower extremities. IO in left upper extremity.  Neurological:  GCS 3T  Skin:  Cool to touch     ED Treatments / Results  Labs (all labs ordered are listed, but only abnormal results are displayed) Labs Reviewed  MRSA PCR SCREENING - Abnormal; Notable for the following:       Result Value   MRSA by PCR POSITIVE (*)    All other components within normal limits  CBC WITH DIFFERENTIAL/PLATELET - Abnormal; Notable for the following:    WBC 17.4 (*)    Platelets 60 (*)    nRBC 12 (*)    Neutro Abs 8.0 (*)    Lymphs Abs 5.7 (*)    Eosinophils Absolute 3.5 (*)    All other components within normal limits  COMPREHENSIVE METABOLIC PANEL - Abnormal; Notable for the following:    Sodium 132 (*)    Potassium 5.7 (*)    CO2 11 (*)    Glucose, Bld 114 (*)    BUN 50 (*)    Creatinine, Ser 4.01 (*)    Calcium 7.4 (*)    Total Protein 5.8 (*)    Albumin 2.4 (*)    AST 1,870 (*)    ALT 1,348 (*)    Alkaline Phosphatase 228 (*)    Total Bilirubin 2.5 (*)    GFR calc non Af Amer 11 (*)    GFR calc Af Amer 13 (*)    Anion gap 19 (*)    All other components within normal limits  LACTIC ACID, PLASMA - Abnormal; Notable for the following:    Lactic Acid, Venous 12.9 (*)    All other components within normal limits  URINALYSIS, ROUTINE W REFLEX  MICROSCOPIC - Abnormal; Notable for the following:    Color, Urine AMBER (*)    APPearance CLOUDY (*)    Hgb urine dipstick LARGE (*)    Protein, ur 30 (*)    Leukocytes, UA LARGE (*)    Bacteria, UA FEW (*)    Squamous Epithelial / LPF 0-5 (*)    All other components within  normal limits  TROPONIN I - Abnormal; Notable for the following:    Troponin I 0.29 (*)    All other components within normal limits  PROTIME-INR - Abnormal; Notable for the following:    Prothrombin Time 50.7 (*)    INR 5.38 (*)    All other components within normal limits  APTT - Abnormal; Notable for the following:    aPTT 130 (*)    All other components within normal limits  GLUCOSE, CAPILLARY - Abnormal; Notable for the following:    Glucose-Capillary 102 (*)    All other components within normal limits  I-STAT CHEM 8, ED - Abnormal; Notable for the following:    BUN 60 (*)    Creatinine, Ser 3.50 (*)    Glucose, Bld 119 (*)    Calcium, Ion 0.91 (*)    All other components within normal limits  I-STAT ARTERIAL BLOOD GAS, ED - Abnormal; Notable for the following:    pH, Arterial 6.823 (*)    pCO2 arterial 29.4 (*)    pO2, Arterial 36.0 (*)    Bicarbonate 5.1 (*)    Acid-base deficit 26.0 (*)    All other components within normal limits  I-STAT CG4 LACTIC ACID, ED - Abnormal; Notable for the following:    Lactic Acid, Venous 3.99 (*)    All other components within normal limits  I-STAT ARTERIAL BLOOD GAS, ED - Abnormal; Notable for the following:    pH, Arterial 7.001 (*)    pCO2 arterial 50.8 (*)    Bicarbonate 13.2 (*)    Acid-base deficit 19.0 (*)    All other components within normal limits  CULTURE, RESPIRATORY (NON-EXPECTORATED)  URINE CULTURE  CULTURE, BLOOD (ROUTINE X 2)  CULTURE, BLOOD (ROUTINE X 2)  PROCALCITONIN  GLUCOSE, CAPILLARY  GLUCOSE, CAPILLARY  HIV ANTIBODY (ROUTINE TESTING)  CORTISOL  LACTIC ACID, PLASMA  CBC  RENAL FUNCTION PANEL  MAGNESIUM  HEMOGLOBIN A1C    MAGNESIUM  PHOSPHORUS  I-STAT TROPOININ, ED  I-STAT CG4 LACTIC ACID, ED  TYPE AND SCREEN    EKG  EKG Interpretation None       Radiology Ct Head Wo Contrast  Result Date: 11/13/2016 CLINICAL DATA:  66 y/o F; shortness of breath leading to cardiac arrest. Concern for intracranial hemorrhage. EXAM: CT HEAD WITHOUT CONTRAST TECHNIQUE: Contiguous axial images were obtained from the base of the skull through the vertex without intravenous contrast. COMPARISON:  10/17/2016 CT of the head. FINDINGS: Brain: No acute intracranial hemorrhage identified. Foci of hypoattenuation within the right cerebral hemisphere peripherally in the right frontal and parietal lobes, right insula, and caudate body are increased in conspicuity from prior CT of the head. No new lesion is identified. No hydrocephalus or extra-axial collection. Vascular: No hyperdense vessel or unexpected calcification. Skull: Normal. Negative for fracture or focal lesion. Sinuses/Orbits: Diffuse paranasal sinus mucosal thickening and air-fluid levels probably due to intubation. Normally aerated mastoid air cells. Orbits are unremarkable. Other: None. IMPRESSION: 1. No acute intracranial hemorrhage identified. 2. Multiple foci of hypoattenuation in the right cerebral hemisphere and basal ganglia are slightly increased in conspicuity. Findings may represent areas of acute infarction or brain lesions with edema. These can be further characterized with MRI of the brain with contrast. Electronically Signed   By: Mitzi Hansen M.D.   On: 11/13/2016 18:57   Dg Chest Port 1 View  Result Date: 11/13/16 CLINICAL DATA:  Hypoxia. Reported attempted central catheter placement EXAM: PORTABLE CHEST 1 VIEW COMPARISON:  October 28, 2016 FINDINGS: Endotracheal tube tip is 2.6 cm above the carina. Nasogastric tube tip and side port are below the diaphragm. No central catheter evident. No pneumothorax. There is airspace consolidation throughout both  mid and lower lung zones. There is underlying interstitial edema as well. Heart is mildly enlarged with pulmonary vascularity showing evidence of mild pulmonary venous hypertension. No adenopathy. No bone lesions. IMPRESSION: Tube positions as described without evident pneumothorax. Findings felt to represent congestive heart failure with interstitial and alveolar edema. It should be noted that superimposed pneumonia in the lower lung zones cannot be excluded radiographically. Both entities may exist concurrently. The appearance of the lungs and cardiac silhouette remains stable compared to earlier in the day. Electronically Signed   By: Bretta Bang III M.D.   On: 11/08/2016 21:21   Dg Chest Port 1 View  Result Date: 10/14/2016 CLINICAL DATA:  Endotracheal tube adjustment.  Intubation granuloma. EXAM: PORTABLE CHEST 1 VIEW COMPARISON:  One-view chest x-ray from the same day. FINDINGS: Heart size is somewhat obscured by diffuse interstitial and airspace disease. The endotracheal tube has been pulled back and now terminates 18 mm above the carina. Diffuse interstitial and airspace disease is unchanged. Defibrillator pads are in place. IMPRESSION: 1. The endotracheal tube has been pulled back and now terminates 18 mm above the carina. It could be pulled back 1-2 cm for more optimal positioning. 2. Similar appearance of diffuse interstitial and airspace disease likely reflecting edema. Infection is not excluded. Electronically Signed   By: Marin Roberts M.D.   On: 11/08/2016 16:02   Dg Chest Portable 1 View  Result Date: 10/19/2016 CLINICAL DATA:  Post CPR, intubation EXAM: PORTABLE CHEST 1 VIEW COMPARISON:  Portable exam 1536 hours without priors for comparison FINDINGS: Tip of endotracheal tube projects in RIGHT mainstem bronchus; this has already been repositioned at time of interpretation of this study. External pacing leads present. Enlargement of cardiac silhouette. Diffuse pulmonary  infiltrates question pulmonary edema. Atherosclerotic calcification aorta. No gross pleural effusion or pneumothorax. Osseous structures appear demineralized with multilevel degenerative disc disease changes thoracic spine. IMPRESSION: RIGHT mainstem bronchus intubation ; this has already been repositioned and reimaged at time of interpretation of this study. Diffuse pulmonary infiltrates question pulmonary edema. Aortic atherosclerosis. Electronically Signed   By: Ulyses Southward M.D.   On: 10/19/2016 16:00    Procedures ARTERIAL LINE Date/Time: 09-Nov-2016 12:04 AM Performed by: Corena Herter Authorized by: Marily Memos   Consent:    Consent obtained:  Emergent situation Indications:    Indications: hemodynamic monitoring   Pre-procedure details:    Skin preparation:  2% Chlorhexidine Procedure details:    Location:  L radial   Needle gauge:  18 G   Placement technique:  Ultrasound guided   Number of attempts:  1   Transducer: waveform confirmed   Post-procedure details:    Post-procedure:  Biopatch applied and secured with tape   Patient tolerance of procedure:  Tolerated well, no immediate complications   (including critical care time)  Medications Ordered in ED Medications  fentaNYL (SUBLIMAZE) 100 MCG/2ML injection (not administered)  midazolam (VERSED) injection 2 mg (2 mg Intravenous Not Given 10/24/2016 1617)  0.9 %  sodium chloride infusion (not administered)  heparin injection 5,000 Units (not administered)  famotidine (PEPCID) IVPB 20 mg premix (not administered)  vasopressin (PITRESSIN) 40 Units in sodium chloride 0.9 % 250 mL (0.16 Units/mL) infusion (0.03 Units/min Intravenous Rate/Dose Verify 10/12/2016 2300)  0.9 %  sodium chloride infusion ( Intravenous Rate/Dose  Verify 11/14/16 2200)  ipratropium-albuterol (DUONEB) 0.5-2.5 (3) MG/3ML nebulizer solution 3 mL (3 mLs Nebulization Given 11/14/16 2012)  budesonide (PULMICORT) nebulizer solution 0.5 mg (0.5 mg Nebulization Given  14-Nov-2016 2012)  insulin aspart (novoLOG) injection 0-9 Units (0 Units Subcutaneous Not Given Nov 14, 2016 2000)  piperacillin-tazobactam (ZOSYN) IVPB 2.25 g (not administered)  norepinephrine (LEVOPHED) 16 mg in dextrose 5 % 250 mL (0.064 mg/mL) infusion (55 mcg/min Intravenous Rate/Dose Verify 14-Nov-2016 2300)  EPINEPHrine (ADRENALIN) 4 mg in dextrose 5 % 250 mL (0.016 mg/mL) infusion (19 mcg/min Intravenous Rate/Dose Change November 14, 2016 2300)  sodium chloride 0.9 % bolus 2,000 mL (2,000 mLs Intravenous New Bag/Given Nov 14, 2016 2334)  hydrocortisone sodium succinate (SOLU-CORTEF) 100 MG injection 50 mg (not administered)  mupirocin ointment (BACTROBAN) 2 % 1 application (not administered)  Chlorhexidine Gluconate Cloth 2 % PADS 6 each (not administered)  sodium chloride 0.9 % bolus 1,000 mL (not administered)  fentaNYL (SUBLIMAZE) injection 50 mcg (50 mcg Intravenous Given 2016/11/14 1547)  norepinephrine (LEVOPHED) 4 mg in dextrose 5 % 250 mL (0.016 mg/mL) infusion (0 mcg/min Intravenous Stopped 11-14-16 1852)  vancomycin (VANCOCIN) 1,750 mg in sodium chloride 0.9 % 500 mL IVPB (1,750 mg Intravenous New Bag/Given 2016-11-14 2143)  piperacillin-tazobactam (ZOSYN) IVPB 3.375 g (3.375 g Intravenous New Bag/Given 2016-11-14 2143)     Initial Impression / Assessment and Plan / ED Course  I have reviewed the triage vital signs and the nursing notes.  Pertinent labs & imaging results that were available during my care of the patient were reviewed by me and considered in my medical decision making (see chart for details).     Patient is a 66 year old female with past medical history significant for COPD, CHF, recent CVA, interstitial lung disease, rheumatoid arthritis, who presents to the emergency department post CPR. ROSC after 30 mins of CPR, epi x 1. Intubated prior to arrival. Initial rhythm PEA, then asytole.   On arrival, BP 100/80s. Difficulty obtaining a manual blood pressure. Portable chest x-ray showed  interstitial edema versus hemorrhage. ET tube repositioned. EKG showed nonspecific changes, no ST elevation. Initial troponin 0.03. Creatinine 3.5. No significant electrolyte abnormalities. ABG 6.82/29/36/5. Lactic acid 4.   Difficulties improving oxygenation, ultimately switched to pressure support. Arterial line placed in order to obtain reliable blood pressure see procedure note above for details. Patient started on levophed and epi infusion. Patient's son, POA at bedside, updated on the critical nature of the patient's condition. Patient wants the patient to be full code.  Patient admitted to the ICU. Patient transferred in critical condition.  Final Clinical Impressions(s) / ED Diagnoses   Final diagnoses:  Intubation granuloma [facility])    New Prescriptions Current Discharge Medication List       Corena Herter, MD 11/09/2016 0006    Marily Memos, MD 10/30/16 [phone]

## 2016-10-28 NOTE — Progress Notes (Signed)
10/16/2016 7:49 PM Pt. Son at bedside and agreeable to placing central line after education provided by RN. ELink notified. Will await call back.  Tippi Mccrae, Blanchard Kelch

## 2016-10-28 NOTE — ED Notes (Signed)
Currently bagging pt to improve o2 sats.

## 2016-10-28 NOTE — ED Notes (Signed)
3 IOs in place upon EMS arrival.  1 to right tibia, 1 to left tibia, 1 to left humerus.  All three removed after IV access acquired

## 2016-10-28 NOTE — ED Triage Notes (Signed)
Per eMS:  Pt presents to ED for assessment after calling out at her nursing facility "[facility]", stating she felt short of breath.  The RN went to get a breathing treatment after noting wheezing, and when she returned patient was in respiratory arrest.  After attempting rescue breathing for patient patient went in to cardiac arrest at 1416.  CPR was completed with one epi dose until ROSC was achieved at 1446.  Pt given Epi for vasodilation en route.

## 2016-10-28 NOTE — Progress Notes (Signed)
eLink Physician-Brief Progress Note Patient Name: Desiree Moore DOB: 10-13-1950 MRN: [id number]   Date of Service  November 27, 2016  HPI/Events of Note  Oximeter not picking up O2 sat - Patient is on multiple vasopressors and is likely clamped down.   eICU Interventions  Will order: 1. Monitor CVP 2. Bolus with 0.9 NaCl 1 liter IV over 1 hour now.      Intervention Category Major Interventions: Hypoxemia - evaluation and management  Ekin Pilar Eugene November 27, 2016, 11:54 PM

## 2016-10-28 NOTE — ED Notes (Signed)
Son at bedside.

## 2016-10-28 NOTE — Progress Notes (Signed)
eLink Physician-Brief Progress Note Patient Name: Desiree Moore DOB: 05-31-1950 MRN: [id number]   Date of Service  11-08-16  HPI/Events of Note  Lactic acid worsening with refractory shock   eICU Interventions  Add 2L normal saline Add hydrocortisone     Intervention Category Intermediate Interventions: Diagnostic test evaluation  Max Fickle Nov 08, 2016, 10:31 PM

## 2016-10-28 NOTE — ED Notes (Signed)
Manual BP 80's

## 2016-10-28 NOTE — ED Notes (Signed)
Unable to obtain bp, present carotid pulse. respiratory to set up A line.

## 2016-10-28 NOTE — Progress Notes (Signed)
PCCM INTERVAL PROGRESS NOTE  Approached patient's son about central line placement as she is on multiple vasopressors and has poor venous access. She will also need frequent blood draws. He was very resistant and aggressive "you want to kill her don't you?". I assured him this was not the case and while the procedure has potential risks, their occurrence is rare. Also explained the risks of continuing current care without central venous access. He was convinced my intention was to harm his mother. I informed him of his right to refuse this procedure for her. He refused.   Joneen Roach, AGACNP-BC [facility] Pulmonology/Critical Care Pager [phone] or [phone]  10/13/2016 5:41 PM

## 2016-10-28 NOTE — Procedures (Signed)
Central Venous Catheter Insertion Procedure Note Desiree Moore [id number] [birth year]  Procedure: Insertion of Central Venous Catheter Indications: Assessment of intravascular volume, Drug and/or fluid administration and Frequent blood sampling  Procedure Details Consent: Risks of procedure as well as the alternatives and risks of each were explained to the (patient/caregiver).  Consent for procedure obtained. Time Out: Verified patient identification, verified procedure, site/side was marked, verified correct patient position, special equipment/implants available, medications/allergies/relevent history reviewed, required imaging and test results available.  Performed  Maximum sterile technique was used including antiseptics, cap, gloves, gown, hand hygiene, mask and sheet. Skin prep: Chlorhexidine; local anesthetic administered A antimicrobial bonded/coated triple lumen catheter was placed in the right femoral vein due to multiple attempts, no other available access using the Seldinger technique.  Evaluation Blood flow good Complications: No apparent complications Patient did tolerate procedure well. Chest X-ray ordered to verify placement.  CXR: N/A.  CXR ordered to confirm no pneumothorax after attempted left IJ CVL placement.  Procedure performed under direct ultrasound guidance for real time vessel cannulation.      Attempted left IJ CVL placement prior.  Small vessel (though greater than right), vessel cannulated but difficult to pass guidewire.  Given elevated INR (reported to me during the procedure), IJ attempt aborted and femoral line placed.   Rutherford Guys, [place] [facility] Pulmonary [facility] Medicine Pager: [phone]  or [phone] 10/31/2016, 8:55 PM   Alyson Reedy, M.D. [facility] Pulmonary/Critical Care Medicine. Pager: [phone]. After hours pager: [phone].

## 2016-10-28 NOTE — Progress Notes (Signed)
eLink Physician-Brief Progress Note Patient Name: Desiree Moore DOB: 05/20/1950 MRN: [id number]   Date of Service  31-Oct-2016  HPI/Events of Note  Troponin slightly elevated post prolonged cardiac arrest  eICU Interventions  Continue current management, troponin elevation not unexpected in this circumstance     Intervention Category Intermediate Interventions: Diagnostic test evaluation  Max Fickle 2016/10/31, 9:08 PM

## 2016-10-28 NOTE — ED Notes (Signed)
CCM paged to MD [place]

## 2016-10-29 ENCOUNTER — Inpatient Hospital Stay (HOSPITAL_COMMUNITY): Payer: Medicare Other

## 2016-10-29 LAB — HEMOGLOBIN A1C
HEMOGLOBIN A1C: 5.8 % — AB (ref 4.8–5.6)
MEAN PLASMA GLUCOSE: 120 mg/dL

## 2016-10-29 LAB — HIV ANTIBODY (ROUTINE TESTING W REFLEX): HIV SCREEN 4TH GENERATION: NONREACTIVE

## 2016-10-29 MED ORDER — SODIUM BICARBONATE 8.4 % IV SOLN
100.0000 meq | Freq: Once | INTRAVENOUS | Status: AC
  Administered 2016-10-29: 100 meq via INTRAVENOUS

## 2016-10-29 MED ORDER — STERILE WATER FOR INJECTION IV SOLN
INTRAVENOUS | Status: DC
  Filled 2016-10-29: qty 850

## 2016-10-29 MED ORDER — SODIUM BICARBONATE 8.4 % IV SOLN
INTRAVENOUS | Status: AC
  Filled 2016-10-29: qty 50

## 2016-10-29 MED ORDER — SODIUM BICARBONATE 8.4 % IV SOLN: 100.0000 meq | Freq: Once | INTRAVENOUS | Status: DC

## 2016-10-30 LAB — URINE CULTURE
Culture: NO GROWTH
SPECIAL REQUESTS: NORMAL

## 2016-11-01 LAB — PROTIME-INR
INR: 5.38
Prothrombin Time: 50.7 seconds — ABNORMAL HIGH (ref 11.4–15.2)

## 2016-11-02 LAB — CULTURE, BLOOD (ROUTINE X 2)
CULTURE: NO GROWTH
Culture: NO GROWTH
Special Requests: ADEQUATE
Special Requests: ADEQUATE

## 2016-11-04 ENCOUNTER — Telehealth: Payer: Self-pay

## 2016-11-04 NOTE — Telephone Encounter (Signed)
On 11/04/16 I received a death certificate from [employer] (original). The death certificate is for cremation. The patient is a patient of Doctor Molli Knock. The death certificate will be taken to E-Link this pm for signature.  On 11/30/16 I received the death certificate back from Doctor Molli Knock. I got the death certificate ready and called the funeral home to let them know the death certificate is ready for pickup. I also faxed a copy to the funeral home per the funeral home request.

## 2016-11-10 NOTE — Progress Notes (Signed)
PCCM Interval Progress Note  Called by Pola Corn MD and asked to assess pt at bedside.  Multiple episodes of recurrent arrest over the past few hours.  Refractory shock despite max dose multiple vasopressors.  Discussion with son at bedside, he has requested that we do not put his mother through further heroic measures given the fact that he does not want to put her through further suffering.  Emotional support offered.  DNR orders placed.   Rutherford Guys, [place] [facility] [place] Pulmonary & Critical Care Medicine Pager: [phone]  or [phone] 10/17/2016, 2:12 AM

## 2016-11-10 NOTE — Code Documentation (Signed)
Resident code team responded to code blue page. Elink MD running code. Will sign off.   Leland Her, DO PGY-1, [employer] [facility] 11/04/2016 12:52 AM

## 2016-11-10 NOTE — Discharge Summary (Signed)
NAMELAYCI, STENGLEIN NO.:  [id number]  MEDICAL RECORD NO.:  [id number]  LOCATION:  2H06C                        FACILITY:  [facility]  PHYSICIAN:  Felipa Evener, MD  DATE OF BIRTH:  1951/01/29  DATE OF ADMISSION:  11-20-16 DATE OF DISCHARGE:                              DISCHARGE SUMMARY   DEATH SUMMARY  PRIMARY DIAGNOSIS/CAUSE OF DEATH:  Pulseless electric activity cardiac arrest.  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  The patient is a 66 year old female with past medical history significant for CHF and COPD, who resides in a nursing home.  On the day of admission, started complaining of inability to breathe.  EMS was called.  By the time EMS arrived, the patient was noted to be in pulseless activity cardiac arrest.  The patient has a total arrest time of 30-40 minutes until return of spontaneous circulation.  The patient was completely unresponsive with respiratory drive.  The patient was admitted to intensive care unit.  Family refused sporadic points of care and on the early morning of the day of following admission, the patient had multiple episodes of cardiac arrest.  During the last episode, the patient's son was approached by the critical care nurse practitioner and after discussion, decision was made to proceed with DNR status.  The patient shortly decompensated and suffered another cardiac arrest and was declared expired and family was notified.     Felipa Evener, MD     WJY/MEDQ  D:  11/05/2016  T:  11/06/2016  Job:  [id number]

## 2016-11-10 NOTE — Progress Notes (Signed)
eLink Physician-Brief Progress Note Patient Name: Desiree Moore DOB: 04/17/1951 MRN: [id number]   Date of Service  11/02/2016  HPI/Events of Note  ABG on Pressure Control = 7.09/>95/52  eICU Interventions  Will order: 1. Ventilator settings: 100%/PRVC 35/TV 500/P 14.  2. ABG already ordered for 2 AM.     Intervention Category Major Interventions: Acid-Base disturbance - evaluation and management;Respiratory failure - evaluation and management  Sommer,Steven Eugene 2016/11/02, 1:11 AM

## 2016-11-10 NOTE — Progress Notes (Addendum)
eLink Physician-Brief Progress Note Patient Name: Desiree Moore DOB: Oct 24, 1950 MRN: [id number]   Date of Service  10/11/2016  HPI/Events of Note  Code Note: Patient noted to have low BP by Aline. ABG order placed and bedside nurses notified by camera. Before the ABG could be obtained, the patient went asystolic. CPR initiated and ROSC obtained after 5 minutes. Patient received 200 meq of NaHCO3, 1 amp of Atropine and 2 amps to epi. ABG pending.   eICU Interventions  Will order: 1. NaHCO3 IV infusion to run at 75 mL/hour.  2. ABG at 2 AM. 3. Portable CXR STAT to r/o pneumothorax.     Intervention Category Major Interventions: Code management / supervision  Sommer,Steven Dennard Nip 10/20/2016, 12:53 AM

## 2016-11-10 DEATH — deceased

## 2018-03-21 IMAGING — DX DG CHEST 1V PORT
1 series · 1 of 1 positions shown · non-contrast
Comparison: October 28, 2016

CLINICAL DATA: Hypoxia. Reported attempted central catheter
placement

EXAM:
PORTABLE CHEST 1 VIEW

[chest ap]
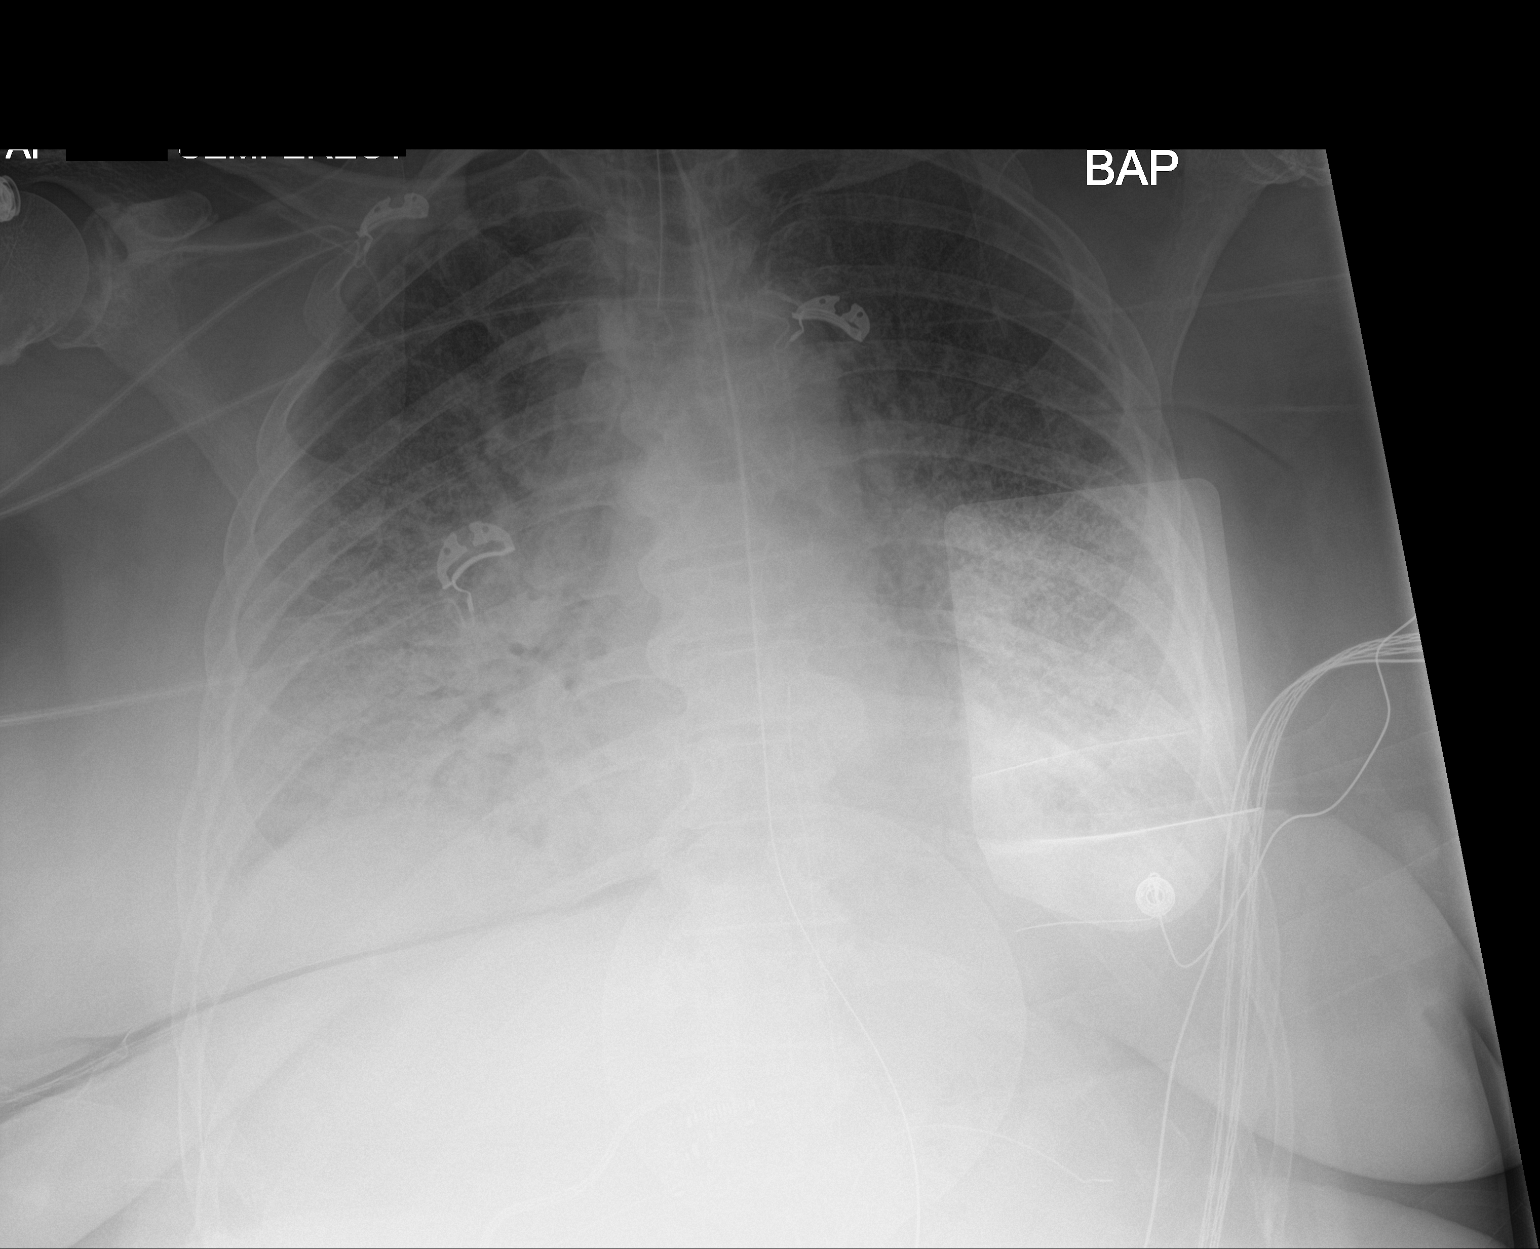

[1 of 1 positions shown; findings below may reference images not displayed]

FINDINGS: Endotracheal tube tip is 2.6 cm above the carina. Nasogastric tube
tip and side port are below the diaphragm. No central catheter
evident. No pneumothorax. There is airspace consolidation throughout
both mid and lower lung zones. There is underlying interstitial
edema as well. Heart is mildly enlarged with pulmonary vascularity
showing evidence of mild pulmonary venous hypertension. No
adenopathy. No bone lesions.
IMPRESSION: Tube positions as described without evident pneumothorax. Findings
felt to represent congestive heart failure with interstitial and
alveolar edema. It should be noted that superimposed pneumonia in
the lower lung zones cannot be excluded radiographically. Both
entities may exist concurrently. The appearance of the lungs and
cardiac silhouette remains stable compared to earlier in the day.
